# Patient Record
Sex: Female | Born: 1980 | Hispanic: Yes | Marital: Single | Smoking: Never smoker
Health system: Southern US, Community
[De-identification: ages and names within clinical notes are randomized; demographics above are authoritative.]

## PROBLEM LIST (undated history)

## (undated) DIAGNOSIS — Z789 Other specified health status: Secondary | ICD-10-CM

## (undated) HISTORY — PX: DILATION AND CURETTAGE OF UTERUS: SHX78

---

## 2004-02-18 ENCOUNTER — Ambulatory Visit: Payer: Self-pay | Admitting: Family Medicine

## 2004-07-12 ENCOUNTER — Inpatient Hospital Stay: Payer: Self-pay

## 2007-01-17 ENCOUNTER — Ambulatory Visit: Payer: Self-pay | Admitting: Family Medicine

## 2007-01-20 ENCOUNTER — Ambulatory Visit: Payer: Self-pay | Admitting: Obstetrics and Gynecology

## 2007-08-06 ENCOUNTER — Ambulatory Visit: Payer: Self-pay | Admitting: Family Medicine

## 2007-10-02 ENCOUNTER — Encounter: Payer: Self-pay | Admitting: Maternal & Fetal Medicine

## 2007-11-13 ENCOUNTER — Encounter: Payer: Self-pay | Admitting: Maternal & Fetal Medicine

## 2007-12-18 ENCOUNTER — Inpatient Hospital Stay: Payer: Self-pay

## 2007-12-30 ENCOUNTER — Emergency Department: Payer: Self-pay | Admitting: Emergency Medicine

## 2016-06-12 ENCOUNTER — Other Ambulatory Visit: Payer: Self-pay | Admitting: Primary Care

## 2016-06-12 DIAGNOSIS — Z3689 Encounter for other specified antenatal screening: Secondary | ICD-10-CM

## 2016-06-25 ENCOUNTER — Ambulatory Visit: Payer: Self-pay

## 2016-07-16 ENCOUNTER — Ambulatory Visit (HOSPITAL_BASED_OUTPATIENT_CLINIC_OR_DEPARTMENT_OTHER)
Admission: RE | Admit: 2016-07-16 | Discharge: 2016-07-16 | Disposition: A | Discharge status: Home / Self Care | Payer: Self-pay | Source: Ambulatory Visit | Attending: Maternal & Fetal Medicine | Admitting: Maternal & Fetal Medicine

## 2016-07-16 ENCOUNTER — Other Ambulatory Visit: Payer: Self-pay | Admitting: *Deleted

## 2016-07-16 ENCOUNTER — Ambulatory Visit
Admission: RE | Admit: 2016-07-16 | Discharge: 2016-07-16 | Disposition: A | Discharge status: Home / Self Care | Payer: Self-pay | Source: Ambulatory Visit | Attending: Maternal & Fetal Medicine | Admitting: Maternal & Fetal Medicine

## 2016-07-16 VITALS — BP 111/59 | HR 83 | Temp 98.0°F | Resp 16 | Ht 63.0 in | Wt 142.2 lb

## 2016-07-16 DIAGNOSIS — O09512 Supervision of elderly primigravida, second trimester: Secondary | ICD-10-CM | POA: Insufficient documentation

## 2016-07-16 DIAGNOSIS — O09522 Supervision of elderly multigravida, second trimester: Secondary | ICD-10-CM | POA: Insufficient documentation

## 2016-07-16 DIAGNOSIS — Z3A20 20 weeks gestation of pregnancy: Secondary | ICD-10-CM | POA: Insufficient documentation

## 2016-07-16 DIAGNOSIS — Z3689 Encounter for other specified antenatal screening: Secondary | ICD-10-CM | POA: Insufficient documentation

## 2016-07-16 DIAGNOSIS — Z9889 Other specified postprocedural states: Secondary | ICD-10-CM

## 2016-07-16 DIAGNOSIS — O09529 Supervision of elderly multigravida, unspecified trimester: Secondary | ICD-10-CM

## 2016-07-16 HISTORY — DX: Other specified health status: Z78.9

## 2016-07-16 NOTE — Progress Notes (Addendum)
Referring Provider:  [facility] Length of Consultation: 40 minutes  [name] was referred to [facility] [place] for genetic counseling because of advanced maternal age.  The patient will be 36 years old at the time of delivery.  This note summarizes the information we discussed.    We explained that the chance of a chromosome abnormality increases with maternal age.  Chromosomes and examples of chromosome problems were reviewed.  Humans typically have 46 chromosomes in each cell, with half passed through each sperm and egg.  Any change in the number or structure of chromosomes can increase the risk of problems in the physical and mental development of a pregnancy.   Based upon age of the patient, the chance of any chromosome abnormality was 1 in 56. The chance of Down syndrome, the most common chromosome problem associated with maternal age, was 1 in 32.  The risk of chromosome problems is in addition to the 3% general population risk for birth defects and mental retardation.  The greatest chance, of course, is that the baby would be born in good health.  We discussed the following prenatal screening and testing options for this pregnancy:  Maternal serum marker screening, a blood test that measures pregnancy proteins, can provide risk assessments for Down syndrome, trisomy 18, and open neural tube defects (spina bifida, anencephaly). Because it does not directly examine the fetus, it cannot positively diagnose or rule out these problems.  Targeted ultrasound uses high frequency sound waves to create an image of the developing fetus.  An ultrasound is often recommended as a routine means of evaluating the pregnancy.  It is also used to screen for fetal anatomy problems (for example, a heart defect) that might be suggestive of a chromosomal or other abnormality.   Amniocentesis involves the removal of a small amount of amniotic fluid from the sac  surrounding the fetus with the use of a thin needle inserted through the maternal abdomen and uterus.  Ultrasound guidance is used throughout the procedure.  Fetal cells from amniotic fluid are directly evaluated and > 99.5% of chromosome problems and > 98% of open neural tube defects can be detected. This procedure is generally performed after the 15th week of pregnancy.  The main risks to this procedure include complications leading to miscarriage in less than 1 in 200 cases (0.5%).  We also reviewed the availability of cell free fetal DNA testing from maternal blood to determine whether or not the baby may have either Down syndrome, trisomy 42, or trisomy 11.  This test utilizes a maternal blood sample and DNA sequencing technology to isolate circulating cell free fetal DNA from maternal plasma.  The fetal DNA can then be analyzed for DNA sequences that are derived from the three most common chromosomes involved in aneuploidy, chromosomes 13, 18, and 21.  If the overall amount of DNA is greater than the expected level for any of these chromosomes, aneuploidy is suspected.  While we do not consider it a replacement for invasive testing and karyotype analysis, a negative result from this testing would be reassuring, though not a guarantee of a normal chromosome complement for the baby.  An abnormal result is certainly suggestive of an abnormal chromosome complement, though we would still recommend amniocentesis to confirm any findings from this testing.  Cystic Fibrosis and Spinal Muscular Atrophy (SMA) screening were also discussed with the patient. Both conditions are recessive, which means that both parents must be carriers in order  to have a child with the disease.  Cystic fibrosis (CF) is one of the most common genetic conditions in persons of Caucasian ancestry.  This condition occurs in approximately 1 in 2,500 Caucasian persons and results in thickened secretions in the lungs, digestive, and  reproductive systems.  For a baby to be at risk for having CF, both of the parents must be carriers for this condition.  Approximately 1 in 6925 Caucasian persons is a carrier for CF.  Current carrier testing looks for the most common mutations in the gene for CF and can detect approximately 90% of carriers in the Caucasian population.  This means that the carrier screening can greatly reduce, but cannot eliminate, the chance for an individual to have a child with CF.  If an individual is found to be a carrier for CF, then carrier testing would be available for the partner. As part of [place]'s newborn screening profile, all babies born in the state of [place] will have a two-tier screening process.  Specimens are first tested to determine the concentration of immunoreactive trypsinogen (IRT).  The top 5% of specimens with the highest IRT values then undergo DNA testing using a panel of over 40 common CF mutations. SMA is a neurodegenerative disorder that leads to atrophy of skeletal muscle and overall weakness.  This condition is also more prevalent in the Caucasian population, with 1 in 40-1 in 60 persons being a carrier and 1 in 6,000-1 in 10,000 children being affected.  There are multiple forms of the disease, with some causing death in infancy to other forms with survival into adulthood.  The genetics of SMA is complex, but carrier screening can detect up to 95% of carriers in the Caucasian population.  Similar to CF, a negative result can greatly reduce, but cannot eliminate, the chance to have a child with SMA.  We obtained a detailed family history and pregnancy history.  The family history is unremarkable for birth defects, developmental delays, recurrent pregnancy loss or known chromosome abnormalities.  [name] stated that this is her fourth pregnancy.  She and her husband have two healthy daughters, ages 8212 and 188.  She had one first trimester miscarriage.  She reported no  complications or exposures in this pregnancy that would be expected to increase the risk for birth defects.  She did report spotting at [redacted] weeks gestation, but none since that time.  After consideration of the options, [name] elected to proceed with an ultrasound only and to decline aneuploidy screening, CF and SMA testing.  An ultrasound was performed at the time of the visit.  The gestational age was consistent with  20 weeks, 5 days.   No markers of aneuploidy were noted, but it is important to remember that a normal ultrasound does not exclude the possibility of birth defect or chromosome condition.  Please refer to the ultrasound report for details of that study.  [name] was encouraged to call with questions or concerns.  We can be contacted at [phone].    Cherly Andersoneborah F. Wells, MS, CGC  I was immediately available and supervising. Argentina PonderAndra H. Desi Rowe, MD [facility]

## 2016-09-06 ENCOUNTER — Other Ambulatory Visit: Payer: Self-pay | Admitting: *Deleted

## 2016-09-06 DIAGNOSIS — O4440 Low lying placenta NOS or without hemorrhage, unspecified trimester: Secondary | ICD-10-CM

## 2016-09-10 ENCOUNTER — Ambulatory Visit
Admission: RE | Admit: 2016-09-10 | Discharge: 2016-09-10 | Disposition: A | Discharge status: Home / Self Care | Payer: Self-pay | Source: Ambulatory Visit | Attending: Maternal & Fetal Medicine | Admitting: Maternal & Fetal Medicine

## 2016-09-10 ENCOUNTER — Other Ambulatory Visit: Payer: Self-pay | Admitting: *Deleted

## 2016-09-10 DIAGNOSIS — Z3A28 28 weeks gestation of pregnancy: Secondary | ICD-10-CM | POA: Insufficient documentation

## 2016-09-10 DIAGNOSIS — O4440 Low lying placenta NOS or without hemorrhage, unspecified trimester: Secondary | ICD-10-CM

## 2016-09-10 DIAGNOSIS — O4403 Placenta previa specified as without hemorrhage, third trimester: Secondary | ICD-10-CM

## 2016-09-10 DIAGNOSIS — O4443 Low lying placenta NOS or without hemorrhage, third trimester: Secondary | ICD-10-CM | POA: Insufficient documentation

## 2016-09-10 DIAGNOSIS — O09523 Supervision of elderly multigravida, third trimester: Secondary | ICD-10-CM | POA: Insufficient documentation

## 2016-11-05 ENCOUNTER — Other Ambulatory Visit: Payer: Self-pay | Admitting: Maternal & Fetal Medicine

## 2016-11-05 ENCOUNTER — Ambulatory Visit
Admission: RE | Admit: 2016-11-05 | Discharge: 2016-11-05 | Disposition: A | Discharge status: Home / Self Care | Payer: Self-pay | Source: Ambulatory Visit | Attending: Maternal & Fetal Medicine | Admitting: Maternal & Fetal Medicine

## 2016-11-05 DIAGNOSIS — O4443 Low lying placenta NOS or without hemorrhage, third trimester: Secondary | ICD-10-CM

## 2016-11-05 DIAGNOSIS — Z3A36 36 weeks gestation of pregnancy: Secondary | ICD-10-CM | POA: Insufficient documentation

## 2016-11-23 ENCOUNTER — Inpatient Hospital Stay
Admission: EM | Admit: 2016-11-23 | Discharge: 2016-11-25 | DRG: 765 | Disposition: A | Discharge status: Home / Self Care | Payer: Medicaid Other | Attending: Obstetrics and Gynecology | Admitting: Obstetrics and Gynecology

## 2016-11-23 ENCOUNTER — Encounter
Admission: EM | Disposition: A | Discharge status: Home / Self Care | Payer: Self-pay | Source: Home / Self Care | Attending: Obstetrics and Gynecology

## 2016-11-23 ENCOUNTER — Inpatient Hospital Stay: Payer: Medicaid Other | Admitting: Anesthesiology

## 2016-11-23 DIAGNOSIS — O324XX0 Maternal care for high head at term, not applicable or unspecified: Principal | ICD-10-CM | POA: Diagnosis present

## 2016-11-23 DIAGNOSIS — O4443 Low lying placenta NOS or without hemorrhage, third trimester: Secondary | ICD-10-CM | POA: Diagnosis present

## 2016-11-23 DIAGNOSIS — Z3A39 39 weeks gestation of pregnancy: Secondary | ICD-10-CM

## 2016-11-23 DIAGNOSIS — Z98891 History of uterine scar from previous surgery: Secondary | ICD-10-CM

## 2016-11-23 DIAGNOSIS — Z3493 Encounter for supervision of normal pregnancy, unspecified, third trimester: Secondary | ICD-10-CM | POA: Diagnosis present

## 2016-11-23 LAB — CBC
HEMATOCRIT: 45.7 % (ref 35.0–47.0)
HEMOGLOBIN: 15.7 g/dL (ref 12.0–16.0)
MCH: 36.9 pg — ABNORMAL HIGH (ref 26.0–34.0)
MCHC: 34.4 g/dL (ref 32.0–36.0)
MCV: 107.3 fL — AB (ref 80.0–100.0)
Platelets: 167 10*3/uL (ref 150–440)
RBC: 4.26 MIL/uL (ref 3.80–5.20)
RDW: 13.2 % (ref 11.5–14.5)
WBC: 18.7 10*3/uL — AB (ref 3.6–11.0)

## 2016-11-23 LAB — TYPE AND SCREEN
ABO/RH(D): A POS
ANTIBODY SCREEN: NEGATIVE

## 2016-11-23 SURGERY — Surgical Case
Anesthesia: Epidural | Site: Abdomen | Wound class: Clean Contaminated

## 2016-11-23 MED ORDER — OXYTOCIN 40 UNITS IN LACTATED RINGERS INFUSION - SIMPLE MED
2.5000 [IU]/h | INTRAVENOUS | Status: DC
  Administered 2016-11-24: 2.5 [IU]/h via INTRAVENOUS
  Filled 2016-11-23: qty 1000

## 2016-11-23 MED ORDER — FENTANYL 2.5 MCG/ML W/ROPIVACAINE 0.15% IN NS 100 ML EPIDURAL (ARMC): EPIDURAL | Status: DC | PRN

## 2016-11-23 MED ORDER — CEFAZOLIN SODIUM-DEXTROSE 2-4 GM/100ML-% IV SOLN
2.0000 g | INTRAVENOUS | Status: AC
  Administered 2016-11-23: 2 g via INTRAVENOUS
  Filled 2016-11-23: qty 100

## 2016-11-23 MED ORDER — OXYCODONE HCL 5 MG PO TABS: 10.0000 mg | ORAL_TABLET | ORAL | Status: DC | PRN

## 2016-11-23 MED ORDER — MEPERIDINE HCL 50 MG/ML IJ SOLN
6.2500 mg | INTRAMUSCULAR | Status: DC | PRN
  Administered 2016-11-23 (×2): 6.25 mg via INTRAVENOUS

## 2016-11-23 MED ORDER — OXYTOCIN 40 UNITS IN LACTATED RINGERS INFUSION - SIMPLE MED
2.5000 [IU]/h | INTRAVENOUS | Status: DC
  Administered 2016-11-23: 1000 mL via INTRAVENOUS
  Filled 2016-11-23: qty 1000

## 2016-11-23 MED ORDER — SODIUM CHLORIDE 0.9% FLUSH: 3.0000 mL | INTRAVENOUS | Status: DC | PRN

## 2016-11-23 MED ORDER — WITCH HAZEL-GLYCERIN EX PADS: 1.0000 "application " | MEDICATED_PAD | CUTANEOUS | Status: DC | PRN

## 2016-11-23 MED ORDER — DIPHENHYDRAMINE HCL 25 MG PO CAPS: 25.0000 mg | ORAL_CAPSULE | ORAL | Status: DC | PRN

## 2016-11-23 MED ORDER — NALBUPHINE HCL 10 MG/ML IJ SOLN: 5.0000 mg | INTRAMUSCULAR | Status: DC | PRN

## 2016-11-23 MED ORDER — ACETAMINOPHEN 325 MG PO TABS
650.0000 mg | ORAL_TABLET | ORAL | Status: DC | PRN
  Administered 2016-11-23: 650 mg via ORAL

## 2016-11-23 MED ORDER — OXYCODONE-ACETAMINOPHEN 5-325 MG PO TABS: 1.0000 | ORAL_TABLET | ORAL | Status: DC | PRN

## 2016-11-23 MED ORDER — BUPIVACAINE HCL (PF) 0.5 % IJ SOLN
INTRAMUSCULAR | Status: AC
  Filled 2016-11-23: qty 30

## 2016-11-23 MED ORDER — MEPERIDINE HCL 50 MG/ML IJ SOLN: 6.2500 mg | INTRAMUSCULAR | Status: DC | PRN

## 2016-11-23 MED ORDER — BUPIVACAINE LIPOSOME 1.3 % IJ SUSP
20.0000 mL | Freq: Once | INTRAMUSCULAR | Status: DC
  Filled 2016-11-23: qty 20

## 2016-11-23 MED ORDER — LIDOCAINE HCL (PF) 1 % IJ SOLN
INTRAMUSCULAR | Status: DC
Start: 2016-11-23 — End: 2016-11-23
  Filled 2016-11-23: qty 30

## 2016-11-23 MED ORDER — DEXTROSE 5 % IV SOLN
500.0000 mg | Freq: Once | INTRAVENOUS | Status: AC
  Administered 2016-11-23: 500 mg via INTRAVENOUS
  Filled 2016-11-23: qty 500

## 2016-11-23 MED ORDER — ONDANSETRON HCL 4 MG/2ML IJ SOLN: 4.0000 mg | Freq: Four times a day (QID) | INTRAMUSCULAR | Status: DC | PRN

## 2016-11-23 MED ORDER — PRENATAL MULTIVITAMIN CH
1.0000 | ORAL_TABLET | Freq: Every day | ORAL | Status: DC
  Administered 2016-11-24 – 2016-11-25 (×2): 1 via ORAL
  Filled 2016-11-23 (×2): qty 1

## 2016-11-23 MED ORDER — KETOROLAC TROMETHAMINE 30 MG/ML IJ SOLN
INTRAMUSCULAR | Status: AC
  Administered 2016-11-23: 30 mg via INTRAVENOUS
  Filled 2016-11-23: qty 1

## 2016-11-23 MED ORDER — ONDANSETRON HCL 4 MG/2ML IJ SOLN: 4.0000 mg | Freq: Three times a day (TID) | INTRAMUSCULAR | Status: DC | PRN

## 2016-11-23 MED ORDER — OXYCODONE HCL 5 MG PO TABS: 5.0000 mg | ORAL_TABLET | Freq: Once | ORAL | Status: DC | PRN

## 2016-11-23 MED ORDER — MISOPROSTOL 200 MCG PO TABS
ORAL_TABLET | ORAL | Status: AC
  Filled 2016-11-23: qty 4

## 2016-11-23 MED ORDER — MEPERIDINE HCL 50 MG/ML IJ SOLN
INTRAMUSCULAR | Status: AC
  Administered 2016-11-23: 6.25 mg via INTRAVENOUS
  Filled 2016-11-23: qty 1

## 2016-11-23 MED ORDER — NALOXONE HCL 0.4 MG/ML IJ SOLN: 0.4000 mg | INTRAMUSCULAR | Status: DC | PRN

## 2016-11-23 MED ORDER — SIMETHICONE 80 MG PO CHEW
80.0000 mg | CHEWABLE_TABLET | Freq: Three times a day (TID) | ORAL | Status: DC
  Administered 2016-11-24 – 2016-11-25 (×5): 80 mg via ORAL
  Filled 2016-11-23 (×5): qty 1

## 2016-11-23 MED ORDER — BUPIVACAINE HCL (PF) 0.25 % IJ SOLN
INTRAMUSCULAR | Status: DC | PRN
  Administered 2016-11-23: 1 mL via INTRATHECAL

## 2016-11-23 MED ORDER — DIBUCAINE 1 % RE OINT: 1.0000 "application " | TOPICAL_OINTMENT | RECTAL | Status: DC | PRN

## 2016-11-23 MED ORDER — AMMONIA AROMATIC IN INHA
RESPIRATORY_TRACT | Status: AC
  Filled 2016-11-23: qty 10

## 2016-11-23 MED ORDER — ACETAMINOPHEN 325 MG PO TABS
ORAL_TABLET | ORAL | Status: AC
  Filled 2016-11-23: qty 2

## 2016-11-23 MED ORDER — FENTANYL CITRATE (PF) 100 MCG/2ML IJ SOLN: 25.0000 ug | INTRAMUSCULAR | Status: DC | PRN

## 2016-11-23 MED ORDER — SOD CITRATE-CITRIC ACID 500-334 MG/5ML PO SOLN
30.0000 mL | ORAL | Status: DC | PRN
  Filled 2016-11-23: qty 15

## 2016-11-23 MED ORDER — BUPIVACAINE HCL (PF) 0.5 % IJ SOLN
INTRAMUSCULAR | Status: DC | PRN
  Administered 2016-11-23: 30 mL

## 2016-11-23 MED ORDER — EPHEDRINE SULFATE 50 MG/ML IJ SOLN
INTRAMUSCULAR | Status: DC | PRN
  Administered 2016-11-23: 10 mg via INTRAVENOUS

## 2016-11-23 MED ORDER — SENNOSIDES-DOCUSATE SODIUM 8.6-50 MG PO TABS
2.0000 | ORAL_TABLET | ORAL | Status: DC
  Administered 2016-11-24 (×2): 2 via ORAL
  Filled 2016-11-23 (×2): qty 2

## 2016-11-23 MED ORDER — CARBOPROST TROMETHAMINE 250 MCG/ML IM SOLN
INTRAMUSCULAR | Status: AC
  Filled 2016-11-23: qty 1

## 2016-11-23 MED ORDER — MEASLES, MUMPS & RUBELLA VAC ~~LOC~~ INJ
0.5000 mL | INJECTION | Freq: Once | SUBCUTANEOUS | Status: DC
  Filled 2016-11-23: qty 0.5

## 2016-11-23 MED ORDER — NITROGLYCERIN IN D5W 200-5 MCG/ML-% IV SOLN
INTRAVENOUS | Status: AC
  Filled 2016-11-23: qty 250

## 2016-11-23 MED ORDER — BUPIVACAINE LIPOSOME 1.3 % IJ SUSP
INTRAMUSCULAR | Status: AC
  Filled 2016-11-23: qty 20

## 2016-11-23 MED ORDER — NITROGLYCERIN 0.4 MG/SPRAY TL SOLN
Status: AC
  Filled 2016-11-23: qty 4.9

## 2016-11-23 MED ORDER — DIPHENHYDRAMINE HCL 50 MG/ML IJ SOLN: 12.5000 mg | INTRAMUSCULAR | Status: DC | PRN

## 2016-11-23 MED ORDER — SIMETHICONE 80 MG PO CHEW
80.0000 mg | CHEWABLE_TABLET | ORAL | Status: DC
  Administered 2016-11-24 (×2): 80 mg via ORAL
  Filled 2016-11-23 (×2): qty 1

## 2016-11-23 MED ORDER — SODIUM CHLORIDE 0.9 % IJ SOLN
INTRAMUSCULAR | Status: AC
Start: 2016-11-23 — End: 2016-11-24
  Filled 2016-11-23: qty 50

## 2016-11-23 MED ORDER — FENTANYL 2.5 MCG/ML W/ROPIVACAINE 0.15% IN NS 100 ML EPIDURAL (ARMC)
EPIDURAL | Status: AC
  Filled 2016-11-23: qty 100

## 2016-11-23 MED ORDER — MORPHINE SULFATE (PF) 0.5 MG/ML IJ SOLN
INTRAMUSCULAR | Status: AC
  Filled 2016-11-23: qty 10

## 2016-11-23 MED ORDER — LACTATED RINGERS IV SOLN
INTRAVENOUS | Status: DC
  Administered 2016-11-24: 13:00:00 via INTRAVENOUS

## 2016-11-23 MED ORDER — SOD CITRATE-CITRIC ACID 500-334 MG/5ML PO SOLN
30.0000 mL | ORAL | Status: AC
  Administered 2016-11-23: 30 mL via ORAL

## 2016-11-23 MED ORDER — KETOROLAC TROMETHAMINE 30 MG/ML IJ SOLN
30.0000 mg | Freq: Four times a day (QID) | INTRAMUSCULAR | Status: AC
  Administered 2016-11-23 – 2016-11-24 (×4): 30 mg via INTRAVENOUS
  Filled 2016-11-23 (×7): qty 1

## 2016-11-23 MED ORDER — OXYTOCIN BOLUS FROM INFUSION: 500.0000 mL | Freq: Once | INTRAVENOUS | Status: DC

## 2016-11-23 MED ORDER — KETOROLAC TROMETHAMINE 30 MG/ML IJ SOLN
30.0000 mg | Freq: Four times a day (QID) | INTRAMUSCULAR | Status: AC
  Filled 2016-11-23 (×4): qty 1

## 2016-11-23 MED ORDER — SIMETHICONE 80 MG PO CHEW: 80.0000 mg | CHEWABLE_TABLET | ORAL | Status: DC | PRN

## 2016-11-23 MED ORDER — LACTATED RINGERS IV SOLN: 500.0000 mL | INTRAVENOUS | Status: DC | PRN

## 2016-11-23 MED ORDER — TETANUS-DIPHTH-ACELL PERTUSSIS 5-2.5-18.5 LF-MCG/0.5 IM SUSP: 0.5000 mL | Freq: Once | INTRAMUSCULAR | Status: DC

## 2016-11-23 MED ORDER — NALBUPHINE HCL 10 MG/ML IJ SOLN: 5.0000 mg | Freq: Once | INTRAMUSCULAR | Status: DC | PRN

## 2016-11-23 MED ORDER — FENTANYL CITRATE (PF) 100 MCG/2ML IJ SOLN
INTRAMUSCULAR | Status: DC | PRN
  Administered 2016-11-23: 100 ug via EPIDURAL

## 2016-11-23 MED ORDER — LIDOCAINE HCL (PF) 1 % IJ SOLN: 30.0000 mL | INTRAMUSCULAR | Status: DC | PRN

## 2016-11-23 MED ORDER — ACETAMINOPHEN 500 MG PO TABS
1000.0000 mg | ORAL_TABLET | Freq: Four times a day (QID) | ORAL | Status: DC
  Administered 2016-11-24 (×3): 1000 mg via ORAL
  Filled 2016-11-23 (×4): qty 2

## 2016-11-23 MED ORDER — ONDANSETRON HCL 4 MG/2ML IJ SOLN
INTRAMUSCULAR | Status: DC | PRN
  Administered 2016-11-23: 4 mg via INTRAVENOUS

## 2016-11-23 MED ORDER — PROMETHAZINE HCL 25 MG/ML IJ SOLN: 6.2500 mg | INTRAMUSCULAR | Status: DC | PRN

## 2016-11-23 MED ORDER — OXYTOCIN 10 UNIT/ML IJ SOLN
INTRAMUSCULAR | Status: AC
  Filled 2016-11-23: qty 2

## 2016-11-23 MED ORDER — SODIUM CHLORIDE 0.9 % IV SOLN
INTRAVENOUS | Status: DC | PRN
  Administered 2016-11-23: 70 mL

## 2016-11-23 MED ORDER — FENTANYL 2.5 MCG/ML W/ROPIVACAINE 0.15% IN NS 100 ML EPIDURAL (ARMC)
EPIDURAL | Status: DC | PRN
  Administered 2016-11-23: 12 mL/h via EPIDURAL

## 2016-11-23 MED ORDER — IBUPROFEN 600 MG PO TABS
600.0000 mg | ORAL_TABLET | Freq: Four times a day (QID) | ORAL | Status: DC
  Administered 2016-11-24 – 2016-11-25 (×3): 600 mg via ORAL
  Filled 2016-11-23 (×3): qty 1

## 2016-11-23 MED ORDER — BISACODYL 10 MG RE SUPP: 10.0000 mg | Freq: Every day | RECTAL | Status: DC | PRN

## 2016-11-23 MED ORDER — OXYCODONE HCL 5 MG PO TABS: 5.0000 mg | ORAL_TABLET | ORAL | Status: DC | PRN

## 2016-11-23 MED ORDER — LACTATED RINGERS IV SOLN
INTRAVENOUS | Status: DC
  Administered 2016-11-23: 19:00:00 via INTRAVENOUS

## 2016-11-23 MED ORDER — LIDOCAINE 2% (20 MG/ML) 5 ML SYRINGE
INTRAMUSCULAR | Status: DC | PRN
  Administered 2016-11-23: 100 mg via INTRAVENOUS
  Administered 2016-11-23: 60 mg via INTRAVENOUS
  Administered 2016-11-23: 100 mg via INTRAVENOUS
  Administered 2016-11-23: 60 mg via INTRAVENOUS

## 2016-11-23 MED ORDER — METHYLERGONOVINE MALEATE 0.2 MG/ML IJ SOLN
INTRAMUSCULAR | Status: AC
  Filled 2016-11-23: qty 1

## 2016-11-23 MED ORDER — MENTHOL 3 MG MT LOZG
1.0000 | LOZENGE | OROMUCOSAL | Status: DC | PRN
  Filled 2016-11-23: qty 9

## 2016-11-23 MED ORDER — MORPHINE SULFATE (PF) 0.5 MG/ML IJ SOLN
INTRAMUSCULAR | Status: DC | PRN
  Administered 2016-11-23: 3 mg via EPIDURAL

## 2016-11-23 MED ORDER — MEDROXYPROGESTERONE ACETATE 150 MG/ML IM SUSP
150.0000 mg | INTRAMUSCULAR | Status: DC
  Administered 2016-11-25: 150 mg via INTRAMUSCULAR
  Filled 2016-11-23: qty 1

## 2016-11-23 MED ORDER — PHENYLEPHRINE HCL 10 MG/ML IJ SOLN
INTRAMUSCULAR | Status: DC | PRN
  Administered 2016-11-23 (×4): 100 ug via INTRAVENOUS

## 2016-11-23 MED ORDER — FENTANYL CITRATE (PF) 100 MCG/2ML IJ SOLN
INTRAMUSCULAR | Status: AC
  Filled 2016-11-23: qty 2

## 2016-11-23 MED ORDER — COCONUT OIL OIL: 1.0000 "application " | TOPICAL_OIL | Status: DC | PRN

## 2016-11-23 MED ORDER — OXYCODONE HCL 5 MG/5ML PO SOLN: 5.0000 mg | Freq: Once | ORAL | Status: DC | PRN

## 2016-11-23 MED ORDER — FLEET ENEMA 7-19 GM/118ML RE ENEM: 1.0000 | ENEMA | Freq: Every day | RECTAL | Status: DC | PRN

## 2016-11-23 MED ORDER — SODIUM CHLORIDE 0.9 % IV SOLN
INTRAVENOUS | Status: DC | PRN
  Administered 2016-11-23 (×2): 5 mL via EPIDURAL

## 2016-11-23 MED ORDER — ACETAMINOPHEN 325 MG PO TABS
650.0000 mg | ORAL_TABLET | ORAL | Status: DC | PRN
  Administered 2016-11-24 – 2016-11-25 (×2): 650 mg via ORAL
  Filled 2016-11-23 (×2): qty 2

## 2016-11-23 MED ORDER — OXYCODONE-ACETAMINOPHEN 5-325 MG PO TABS: 2.0000 | ORAL_TABLET | ORAL | Status: DC | PRN

## 2016-11-23 MED ORDER — LIDOCAINE-EPINEPHRINE (PF) 1.5 %-1:200000 IJ SOLN
INTRAMUSCULAR | Status: DC | PRN
  Administered 2016-11-23: 3 mL

## 2016-11-23 SURGICAL SUPPLY — 28 items
BARRIER ADHS 3X4 INTERCEED (GAUZE/BANDAGES/DRESSINGS) ×3 IMPLANT
CANISTER SUCT 3000ML PPV (MISCELLANEOUS) ×3 IMPLANT
CHLORAPREP W/TINT 26ML (MISCELLANEOUS) ×6 IMPLANT
DERMABOND ADVANCED (GAUZE/BANDAGES/DRESSINGS) ×2
DERMABOND ADVANCED .7 DNX12 (GAUZE/BANDAGES/DRESSINGS) ×1 IMPLANT
DRSG TELFA 3X8 NADH (GAUZE/BANDAGES/DRESSINGS) IMPLANT
ELECT REM PT RETURN 9FT ADLT (ELECTROSURGICAL) ×3
ELECTRODE REM PT RTRN 9FT ADLT (ELECTROSURGICAL) ×1 IMPLANT
GAUZE SPONGE 4X4 12PLY STRL (GAUZE/BANDAGES/DRESSINGS) ×3 IMPLANT
GLOVE BIO SURGEON STRL SZ 6.5 (GLOVE) ×6 IMPLANT
GLOVE BIO SURGEON STRL SZ7.5 (GLOVE) ×9 IMPLANT
GLOVE BIO SURGEONS STRL SZ 6.5 (GLOVE) ×3
GOWN STRL REUS W/ TWL LRG LVL3 (GOWN DISPOSABLE) ×3 IMPLANT
GOWN STRL REUS W/TWL LRG LVL3 (GOWN DISPOSABLE) ×6
NS IRRIG 1000ML POUR BTL (IV SOLUTION) ×3 IMPLANT
PAD OB MATERNITY 4.3X12.25 (PERSONAL CARE ITEMS) ×6 IMPLANT
PAD PREP 24X41 OB/GYN DISP (PERSONAL CARE ITEMS) ×3 IMPLANT
SPONGE LAP 18X18 5 PK (GAUZE/BANDAGES/DRESSINGS) ×6 IMPLANT
SUT MNCRL 4-0 (SUTURE)
SUT MNCRL 4-0 27XMFL (SUTURE)
SUT PDS AB 1 TP1 96 (SUTURE) ×3 IMPLANT
SUT PLAIN 2 0 XLH (SUTURE) ×3 IMPLANT
SUT PLAIN GUT 2-0 30 C14 SG823 (SUTURE) ×3
SUT VIC AB 0 CT1 36 (SUTURE) ×9 IMPLANT
SUT VIC AB 3-0 SH 27 (SUTURE) ×2
SUT VIC AB 3-0 SH 27X BRD (SUTURE) ×1 IMPLANT
SUTURE MNCRL 4-0 27XMF (SUTURE) IMPLANT
SUTURE PLN GUT2-0 30 C14 SG823 (SUTURE) ×1 IMPLANT

## 2016-11-23 NOTE — Progress Notes (Signed)
No further progress with pushing x30 more minutes. Urgent cesarean called.  The risks of cesarean section discussed with the patient included but were not limited to: bleeding which may require transfusion or reoperation; infection which may require antibiotics; injury to bowel, bladder, ureters or other surrounding organs; injury to the fetus; need for additional procedures including hysterectomy in the event of a life-threatening hemorrhage; placental abnormalities wth subsequent pregnancies, incisional problems, thromboembolic phenomenon and other postoperative/anesthesia complications. The patient concurred with the proposed plan, giving informed written consent for the procedure.  Anesthesia and OR aware. Preoperative prophylactic antibiotics and SCDs ordered on call to the OR.  To OR when ready.   Patient ID: Faith Wolfe, female   DOB: [birth year], 36 y.o.   MRN: [id number]

## 2016-11-23 NOTE — Anesthesia Procedure Notes (Signed)
Epidural Patient location during procedure: OB Start time: 11/23/2016 3:45 PM End time: 11/23/2016 4:02 PM  Staffing Anesthesiologist: Priscella MannPENWARDEN, Larita Deremer  Preanesthetic Checklist Completed: patient identified, site marked, surgical consent, pre-op evaluation, timeout performed, IV checked, risks and benefits discussed and monitors and equipment checked  Epidural Patient position: sitting Prep: ChloraPrep Patient monitoring: heart rate, continuous pulse ox and blood pressure Approach: midline Location: L4-L5 Injection technique: LOR saline  Needle:  Needle type: Tuohy  Needle gauge: 18 G Needle length: 9 cm and 9 Needle insertion depth: 5 cm Catheter type: closed end flexible Catheter size: 20 Guage Catheter at skin depth: 9 cm Test dose: negative  Assessment Events: blood not aspirated, injection not painful, no injection resistance, negative IV test and no paresthesia  Additional Notes CSE performed with needle through needle technique using 25g pencil tip spinal needle. 1mL 0.25% bupivacaine given for spinal dose.   Patient tolerated the insertion well without complications.Reason for block:procedure for pain

## 2016-11-23 NOTE — Progress Notes (Signed)
Intrapartum progress note:  S: breathing through painful contractions  O: BP 115/75   Pulse 75   Temp 98.3 F (36.8 C) (Oral)   Resp 18   Ht 5\' 4"  (1.626 m)   Wt 72.6 kg (160 lb)   LMP 02/22/2016   BMI 27.46 kg/m   FHT: 145 mod + accels no decels TOCO: q102min SVE: 10/100/-1  AROM for meconium, thin  A/P: [id number] @ 39.2 with labor  1. IUP: category 1 2. Labor: expectant management after AROM.  Push with one contraction ineffective, will let her labor down until more pressure felt or urge to push.    spainsh interpreter at bedside ----- Ranae Plumberhelsea Eleonora Peeler, MD Attending Obstetrician and Gynecologist [facility], Department of OB/GYN [facility]

## 2016-11-23 NOTE — Op Note (Signed)
  Cesarean Section Procedure Note  Date of procedure: 11/23/2016   Pre-operative Diagnosis: Intrauterine pregnancy at 8267w2d; arrest of descent at fully dilated with 4 hours of pushing; meconium stained fluid  Post-operative Diagnosis: same, delivered.  Procedure: Primary Low Transverse Cesarean Section through Pfannenstiel incision  Surgeon: Christeen DouglasBethany Cordelro Gautreau, MD  Assistant(s):  CST  Anesthesia: Epidural anesthesia and Spinal anesthesia  Anesthesiologist: No responsible provider has been recorded for the case. Anesthesiologist: Alver FisherPenwarden, Amy, MD CRNA: Irving BurtonBachich, Jennifer, CRNA  Estimated Blood Loss:  950         Drains: none         Total IV Fluids: 600ml  Urine Output: 100ml         Specimens: none         Complications:  None; patient tolerated the procedure well.         Disposition: PACU - hemodynamically stable.         Condition: stable  Findings:  A female infant in cephalic presentation. Amniotic fluid - Meconium  Birth weight 3930 g.  Apgars of 8 and 9 at one and five minutes respectively.  Intact placenta with a three-vessel cord.  Grossly normal uterus, tubes and ovaries bilaterally. No intraabdominal adhesions were noted.  Indications: failure to progress: arrest of descent  Procedure Details  The patient was taken to Operating Room, identified as the correct patient and the procedure verified as C-Section Delivery. A formal Time Out was held with all team members present and in agreement.  After induction of anesthesia, the patient was draped and prepped in the usual sterile manner. A Pfannenstiel skin incision was made and carried down through the subcutaneous tissue to the fascia. Fascial incision was made and extended transversely with the Mayo scissors. The fascia was separated from the underlying rectus tissue superiorly and inferiorly. The peritoneum was identified and entered bluntly. Peritoneal incision was extended longitudinally. The utero-vesical  peritoneal reflection was incised transversely and a bladder flap was created digitally.   A low transverse hysterotomy was made. The fetus was delivered atraumatically. The umbilical cord was clamped x2 and cut and the infant was handed to the awaiting pediatricians. The placenta was removed intact and appeared normal, intact, and with a 3-vessel cord.   The uterus was exteriorized and cleared of all clot and debris. The hysterotomy was closed with running sutures of 0-Vicryl. A second imbricating layer was placed with the same suture. Excellent hemostasis was observed. The peritoneal cavity was cleared of all clots and debris. The uterus was returned to the abdomen.   The pelvis was irrigated and again, excellent hemostasis was noted. The fascia was then reapproximated with running sutures of 0 Vicryl.  The subcutaneous tissue was reapproximated with running sutures of 0 Vicry. The skin was reapproximated with a 4-0 Monocryl subcuticular stitch. 20ml (in 30 of 0.5% bupivicaine and 50ml of NSS) of liposomal bupivicaine placed in the fascial and skin lines.  Instrument, sponge, and needle counts were correct prior to the abdominal closure and at the conclusion of the case.   The patient tolerated the procedure well and was transferred to the recovery room in stable condition.   Christeen DouglasBethany Bonne Whack, MD 11/23/2016

## 2016-11-23 NOTE — Anesthesia Preprocedure Evaluation (Signed)
Anesthesia Evaluation  Patient identified by MRN, date of birth, ID band Patient awake    Reviewed: Allergy & Precautions, NPO status , Patient's Chart, lab work & pertinent test results  History of Anesthesia Complications Negative for: history of anesthetic complications  Airway Mallampati: II  TM Distance: >3 FB Neck ROM: Full    Dental no notable dental hx.    Pulmonary neg pulmonary ROS, neg sleep apnea, neg COPD,    breath sounds clear to auscultation- rhonchi (-) wheezing      Cardiovascular Exercise Tolerance: Good (-) hypertension(-) CAD and (-) Past MI  Rhythm:Regular Rate:Normal - Systolic murmurs and - Diastolic murmurs    Neuro/Psych negative neurological ROS  negative psych ROS   GI/Hepatic negative GI ROS, Neg liver ROS,   Endo/Other  negative endocrine ROSneg diabetes  Renal/GU negative Renal ROS     Musculoskeletal negative musculoskeletal ROS (+)   Abdominal (+) - obese, Gravid abdomen  Peds  Hematology negative hematology ROS (+)   Anesthesia Other Findings   Reproductive/Obstetrics (+) Pregnancy                             Anesthesia Physical Anesthesia Plan  ASA: II  Anesthesia Plan: Epidural   Post-op Pain Management:    Induction:   PONV Risk Score and Plan: 2  Airway Management Planned:   Additional Equipment:   Intra-op Plan:   Post-operative Plan:   Informed Consent: I have reviewed the patients History and Physical, chart, labs and discussed the procedure including the risks, benefits and alternatives for the proposed anesthesia with the patient or authorized representative who has indicated his/her understanding and acceptance.     Plan Discussed with: Anesthesiologist  Anesthesia Plan Comments: (Plan for epidural for labor, discussed epidural vs spinal vs GA if need for csection)        Lab Results  Component Value Date   WBC 18.7  (H) 11/23/2016   HGB 15.7 11/23/2016   HCT 45.7 11/23/2016   MCV 107.3 (H) 11/23/2016   PLT 167 11/23/2016    Anesthesia Quick Evaluation

## 2016-11-23 NOTE — H&P (Signed)
OB ADMISSION/ HISTORY & PHYSICAL:  Admission Date: 11/23/2016 11:04 AM  Admit Diagnosis: Contractions  [name] is a 36 y.o. female presenting for contractions.  Prenatal History: G4P0   EDC : 11/28/2016, by Last Menstrual Period  Prenatal care at Primary Ob Provider: [facility]  Prenatal course complicated by hx of LEEP 2013 Low lying placenta, last measured 11/05/16 2.4cm from internal os  Prenatal Labs: ABO, Rh: --/--/A POS (08/03 1313) Antibody: NEG (08/03 1313) Rubella:   imm RPR:   non reactice HBsAg:   neg HIV:   neg GTT: neg GBS:   neg Medical / Surgical History :  Past medical history:  Past Medical History:  Diagnosis Date  . Medical history non-contributory      Past surgical history:  Past Surgical History:  Procedure Laterality Date  . DILATION AND CURETTAGE OF UTERUS      Family History: History reviewed. No pertinent family history.   Social History:  reports that she has never smoked. She has never used smokeless tobacco. She reports that she does not drink alcohol or use drugs.   Allergies: Patient has no known allergies.    Current Medications at time of admission:  Prior to Admission medications   Medication Sig Start Date End Date Taking? Authorizing Provider  Prenatal Vit-Fe Fumarate-FA (MULTIVITAMIN-PRENATAL) 27-0.8 MG TABS tablet Take 1 tablet by mouth daily at 12 noon.   Yes [provider]     Review of Systems: Active FM  Physical Exam:  VS: Blood pressure (!) 108/51, pulse 76, temperature 98.3 F (36.8 C), temperature source Oral, resp. rate 18, height 5\' 4"  (1.626 m), weight 160 lb (72.6 kg), last menstrual period 02/22/2016, SpO2 100 %.  General: alert and oriented, appears nad Heart: RRR Lungs: Clear lung fields Abdomen: Gravid, soft and non-tender, non-distended Extremities: no edema  Genitalia / VE: Dilation: 10 Effacement (%): 100 Station: -2 Exam by:: ward   FHR: baseline rate 140 /  variability mod / accelerations + / + decelerations TOCO: q2-3  Assessment: 39+[redacted] weeks gestation 1 stage of labor FHR category 2  Assuming care now. Pt fully dilated at 14:22 with inadequate pushing in -2 station and OP position. Pt received epidural now and is comfortable. By my exam, she is still -1. No meconium noted, but reported with pushing. Per prior provider, no fetal movement with pushing. Pt is NSVD x2 without complications previously. Will allow labor down now and restart pushing. However, will monitor fetal status and alter plan as clinically indicated.  Plan:  Admit for active labor Labs pending Epidural when desired Continuous fetal monitoring   1. Fetal Well being  - Fetal Tracing: Cat I- intrauterine res planned - Ultrasound: reviewed, as above - Group B Streptococcus: neg - Presentation: vtx confirmed by sutures   2. Routine OB: - Prenatal labs reviewed, as above - Rh a pos

## 2016-11-23 NOTE — Progress Notes (Signed)
Progress note: S: comfortable with epidural O:  Vitals:   11/23/16 1651 11/23/16 1652  BP: (!) 86/38 (!) 85/41  Pulse: 75 75  Resp:    Temp:      SVE: 10/100/-2/LOA Toco: q2 min Thick meconium with pushing. FHT: Cat II with mod var, occasional late decels,   A/P: Multiparous patient, Fully dilated x3 hrs.  Pushing with patient, but no fetal descent. Pt strongly desires vaginal delivery. Will continue to push until 1800, and will proceed with cesarean section at that time if no clear movement.

## 2016-11-23 NOTE — Discharge Summary (Signed)
Obstetrical Discharge Summary  Patient Name: Faith Wolfe DOB: 1980/07/07 MRN: [id number]  Date of Admission: 11/23/2016 Date of Discharge: 11/25/2016  Primary OB: [facility]  Gestational Age at Delivery: 7828w2d   Antepartum complications:  Prenatal course complicated by hx of LEEP 2013 Low lying placenta, last measured 11/05/16 2.4cm from internal os  Admitting Diagnosis: Active labor Secondary Diagnosis:Arrest of descent  Patient Active Problem List   Diagnosis Date Noted  . Indication for care in labor or delivery 11/23/2016  . Advanced maternal age in multigravida, second trimester     Augmentation: AROM Complications: None Intrapartum complications/course: Arrest of descent at -2 station, fully dilated for 5 hours Date of Delivery: 11/23/16 Delivered By: Christeen DouglasBethany Beasley  Delivery Type: primary cesarean section, low transverse incision Anesthesia: epidural Placenta: Spontaneous Laceration:  Episiotomy: none Newborn Data: Live born female "Gabon[place]iana" Birth Weight: 8 lb 10.6 oz (3930 g) APGAR: 8, 9    Discharge Physical Exam:  BP (!) 100/53 (BP Location: Left Arm)   Pulse 84   Temp 98.1 F (36.7 C) (Oral)   Resp 18   Ht 5\' 4"  (1.626 m)   Wt 72.6 kg (160 lb)   LMP 02/22/2016   SpO2 100%   Breastfeeding? Unknown   BMI 27.46 kg/m   General: NAD CV: RRR Pulm: CTABL, nl effort ABD: s/nd/nt, fundus firm and below the umbilicus Lochia: moderate Incision: c/d/i DVT Evaluation: LE non-ttp, no evidence of DVT on exam.  Hemoglobin  Date Value Ref Range Status  11/24/2016 12.2 12.0 - 16.0 g/dL Final   HCT  Date Value Ref Range Status  11/24/2016 34.4 (L) 35.0 - 47.0 % Final    Post partum course: asymptomatic and stable low blood pressure  Postpartum Procedures: none Disposition: stable, discharge to home. Baby Feeding: breastmilk  Baby Disposition: home with mom  Rh Immune globulin given: n/a Rubella vaccine given: no Tdap vaccine given in  AP or PP setting: offered PP Flu vaccine given in AP or PP setting: n/a  Contraception: Depo  Prenatal Labs:  ABO, Rh: --/--/A POS (08/03 1313) Antibody: NEG (08/03 1313) Rubella:   imm RPR:   non reactice HBsAg:   neg HIV:   neg GTT: neg GBS:   neg   Plan:  Faith Wolfe was discharged to home in good condition. [facility]   Discharge Medications: Allergies as of 11/25/2016   No Known Allergies     Medication List    TAKE these medications   acetaminophen 325 MG tablet Commonly known as:  TYLENOL Take 3 tablets (975 mg total) by mouth every 6 (six) hours.   ibuprofen 600 MG tablet Commonly known as:  ADVIL,MOTRIN Take 1 tablet (600 mg total) by mouth every 6 (six) hours.   multivitamin-prenatal 27-0.8 MG Tabs tablet Take 1 tablet by mouth daily at 12 noon.   oxyCODONE 5 MG immediate release tablet Commonly known as:  Oxy IR/ROXICODONE Take 1 tablet (5 mg total) by mouth every 4 (four) hours as needed for moderate pain.       Follow-up Information    Christeen DouglasBeasley, Bethany, MD Follow up in 2 week(s).   Specialty:  Obstetrics and Gynecology Why:  for post op check Contact information: [address] [place] [address] [phone]           Signed: ----- Ranae Plumberhelsea Shawnte Demarest, MD Attending Obstetrician and Gynecologist [facility], Department of OB/GYN [facility]

## 2016-11-23 NOTE — Progress Notes (Signed)
Intrapartum progress note  S: says she lacks the strength to push, and will not.  O: BP 115/75   Pulse 75   Temp 98.3 F (36.8 C) (Oral)   Resp 18   Ht 5\' 4"  (1.626 m)   Wt 72.6 kg (160 lb)   LMP 02/22/2016   BMI 27.46 kg/m   FHT: 130 min to mod +accels no decels  TOCO: q2-23min SVE: 10/100/-2  A/P: 36you [id number] @ 39.2 with labor  1. Complete and refusing to push. We have tried to have her in different positions.  Fetus rotated from OT to OP.  Too high in pelvis for operative delivery. Discussed options:  Epidural or spinal now to relax her and help her with pain relief to allow her to delivery vaginally, in minimal pain Cesarean delivery, which would be reserved for failure to descend after adequate pushing.   Will attempt regional anesthesia to try to get her to effectively push.   ----- Ranae Plumberhelsea Ward, MD Attending Obstetrician and Gynecologist [facility], Department of OB/GYN [facility]

## 2016-11-23 NOTE — Transfer of Care (Signed)
Immediate Anesthesia Transfer of Care Note  Patient: Faith Wolfe  Procedure(s) Performed: Procedure(s): CESAREAN SECTION (N/A)  Patient Location: PACU  Anesthesia Type:Epidural  Level of Consciousness: awake, alert  and oriented  Airway & Oxygen Therapy: Patient Spontanous Breathing  Post-op Assessment: Report given to RN  Post vital signs: stable  Last Vitals:  Vitals:   11/23/16 1853 11/23/16 2047  BP: (!) 98/58 (!) 92/58  Pulse: 92 93  Resp:  16  Temp:  36.8 C    Last Pain:  Vitals:   11/23/16 2047  TempSrc: Temporal  PainSc:          Complications: No apparent anesthesia complications

## 2016-11-23 NOTE — Anesthesia Post-op Follow-up Note (Cosign Needed)
Anesthesia QCDR form completed.        

## 2016-11-24 LAB — CBC
HCT: 34.4 % — ABNORMAL LOW (ref 35.0–47.0)
Hemoglobin: 12.2 g/dL (ref 12.0–16.0)
MCH: 38.1 pg — ABNORMAL HIGH (ref 26.0–34.0)
MCHC: 35.4 g/dL (ref 32.0–36.0)
MCV: 107.5 fL — ABNORMAL HIGH (ref 80.0–100.0)
Platelets: 137 K/uL — ABNORMAL LOW (ref 150–440)
RBC: 3.2 MIL/uL — ABNORMAL LOW (ref 3.80–5.20)
RDW: 13.1 % (ref 11.5–14.5)
WBC: 15.8 K/uL — ABNORMAL HIGH (ref 3.6–11.0)

## 2016-11-24 LAB — RPR: RPR: NONREACTIVE

## 2016-11-24 MED ORDER — LACTATED RINGERS IV BOLUS (SEPSIS)
500.0000 mL | Freq: Once | INTRAVENOUS | Status: AC
  Administered 2016-11-24: 500 mL via INTRAVENOUS

## 2016-11-24 NOTE — Anesthesia Postprocedure Evaluation (Signed)
Anesthesia Post Note  Patient: Faith Wolfe  Procedure(s) Performed: Procedure(s) (LRB): CESAREAN SECTION (N/A)  Patient location during evaluation: PACU Anesthesia Type: Epidural and Spinal Level of consciousness: oriented and awake and alert Pain management: pain level controlled Vital Signs Assessment: post-procedure vital signs reviewed and stable Respiratory status: spontaneous breathing, respiratory function stable and patient connected to nasal cannula oxygen Cardiovascular status: blood pressure returned to baseline and stable Postop Assessment: no headache and no backache Anesthetic complications: no     Last Vitals:  Vitals:   11/24/16 0600 11/24/16 0743  BP:  (!) 82/41  Pulse:  82  Resp: 18 18  Temp:  37.1 C    Last Pain:  Vitals:   11/24/16 0745  TempSrc:   PainSc: 0-No pain                 Dorisann Schwanke S

## 2016-11-24 NOTE — Progress Notes (Signed)
Subjective: Postpartum Day 1: Cesarean Delivery Patient reports tolerating PO and no problems voiding.  Pain controlled  Objective: Vital signs in last 24 hours: Temp:  [97.9 F (36.6 C)-99.6 F (37.6 C)] 98.2 F (36.8 C) (08/04 0500) Pulse Rate:  [66-140] 83 (08/04 0500) Resp:  [14-20] 18 (08/04 0600) BP: (77-117)/(35-102) 100/54 (08/04 0500) SpO2:  [93 %-100 %] 98 % (08/04 0600) Weight:  [160 lb (72.6 kg)] 160 lb (72.6 kg) (08/03 1127)  Physical Exam:  General: alert, cooperative and appears stated age 18Lochia: appropriate Uterine Fundus: firm Incision: healing well, no significant drainage, no dehiscence, no significant erythema DVT Evaluation: No evidence of DVT seen on physical exam. Negative Homan's sign. No cords or calf tenderness.   Recent Labs  11/23/16 1313  HGB 15.7  HCT 45.7    Assessment/Plan: Status post Cesarean section. Doing well postoperatively.  Continue current care.  Christeen DouglasBethany Oriyah Lamphear 11/24/2016, 7:38 AM

## 2016-11-25 ENCOUNTER — Encounter: Payer: Self-pay | Admitting: Obstetrics and Gynecology

## 2016-11-25 MED ORDER — IBUPROFEN 600 MG PO TABS: 600.0000 mg | ORAL_TABLET | Freq: Four times a day (QID) | ORAL | 0 refills | Status: AC

## 2016-11-25 MED ORDER — ACETAMINOPHEN 325 MG PO TABS: 1000.0000 mg | ORAL_TABLET | Freq: Four times a day (QID) | ORAL | Status: AC

## 2016-11-25 MED ORDER — OXYCODONE HCL 5 MG PO TABS: 5.0000 mg | ORAL_TABLET | ORAL | 0 refills | Status: AC | PRN

## 2016-11-25 NOTE — Discharge Instructions (Signed)

## 2016-11-25 NOTE — Progress Notes (Signed)
Discharge instructions complete and prescriptions given. Patient verbalizes understanding of teaching. Patient discharged home at 1400. 

## 2016-12-04 ENCOUNTER — Other Ambulatory Visit: Payer: Self-pay | Admitting: Obstetrics and Gynecology

## 2017-10-31 IMAGING — US US MFM OB FOLLOW-UP
1 series · 12 of 28 positions shown · non-contrast
Comparison: none

PATIENT INFO:

MORALES
PERFORMED BY:
[facility]
SERVICE(S) PROVIDED:
INDICATIONS:
36 weeks gestation of pregnancy
FETAL EVALUATION:
Num Of Fetuses:     1
Presentation:       Vertex
Placenta:           Posterior Grade 2, No previa
AFI Sum(cm)     %Tile       Largest Pocket(cm)
17.59           66
RUQ(cm)       RLQ(cm)       LUQ(cm)        LLQ(cm)
4.45
BIOMETRY:
BPD:      85.5  mm     G. Age:  34w 3d          9  %    CI:        72.93   %    70 - 86
FL/HC:       22.1  %    20.8 -
HC:      318.3  mm     G. Age:  35w 6d          9  %    HC/AC:       0.90       0.92 -
AC:      352.6  mm     G. Age:  39w 1d       > 97  %    FL/BPD:      82.1  %    71 - 87
FL:       70.2  mm     G. Age:  36w 0d         30  %    FL/AC:       19.9  %    20 - 24
HUM:      62.6  mm     G. Age:  36w 2d         60  %
Est. FW:    4774   gm     7 lb 2 oz     70  %
GESTATIONAL AGE:
LMP:           36w 5d        Date:  02/22/16                 EDD:   11/28/16
U/S Today:     36w 3d                                        EDD:   11/30/16
Best:          36w 5d     Det. By:  LMP  (02/22/16)          EDD:   11/28/16
ANATOMY:
Cavum:                 Visualized             Stomach:                Seen
previously
Ventricles:            Normal appearance      Abdominal Wall:         Visualized
Cerebellum:            Visualized             Cord Vessels:           3 vessels,
previously                                     visualized previously
Posterior Fossa:       Visualized             Kidneys:                Normal appearance
Face:                  Orbits visualized      Bladder:                Seen
Lips:                  Visualized             Spine:                  Visualized
previously                                     previously
Heart:                 Seen on Prior          Upper Extremities:      Visualized
RVOT:                  Seen on prior          Lower Extremities:      Visualized
LVOT:                  Seen on prior
CERVIX UTERUS ADNEXA:
Cervix
Length:            2.4  cm.
Comment:      Transvaginal ultrasound showed the posterior
placenta to be at least 2.4 cm from the cervical
os.

[Series 1: us mfm ob follow-up · 12 of 29 slices shown]
[im 2/29]
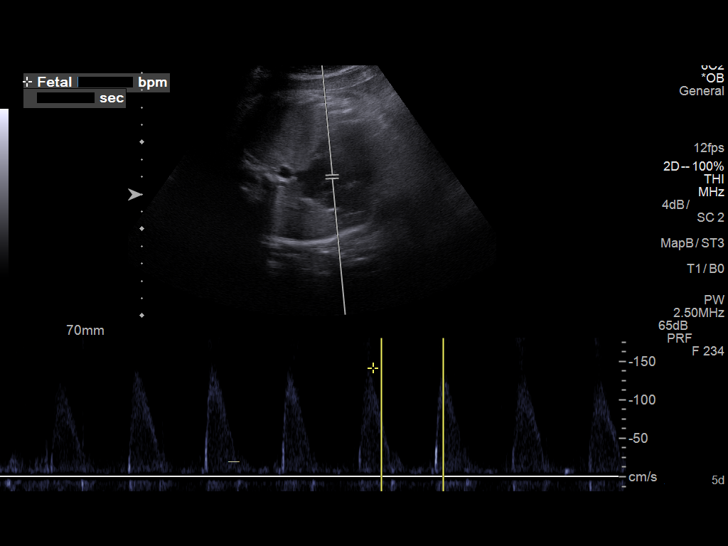
[im 4/29]
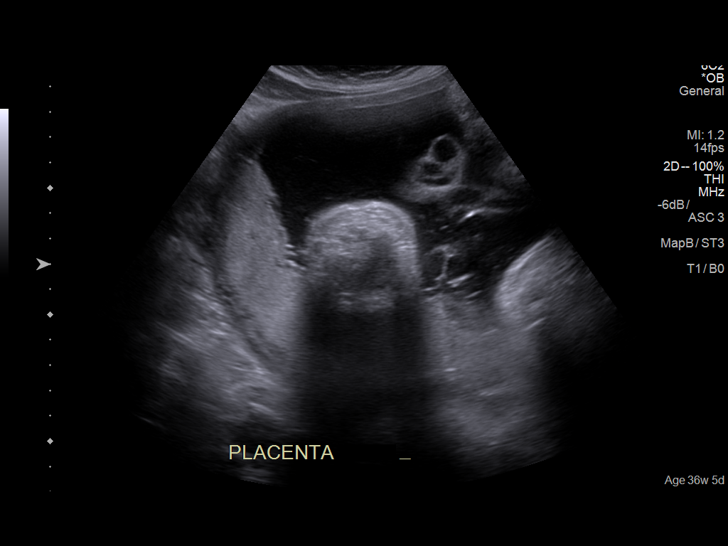
[im 6/29]
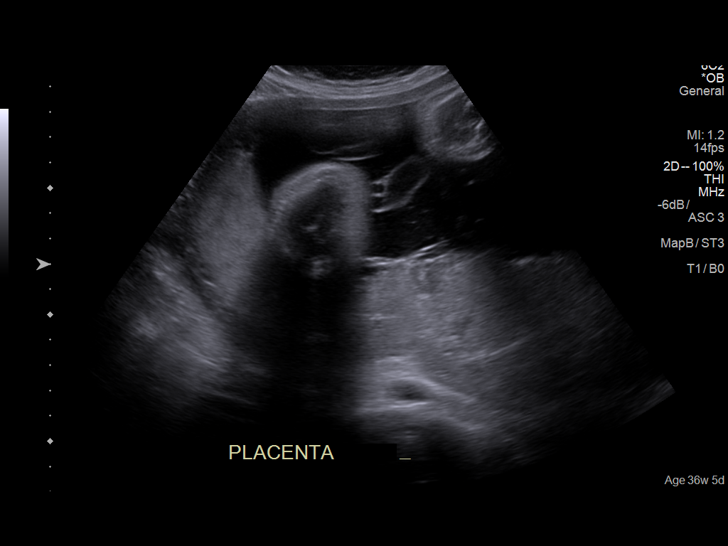
[im 9/29]
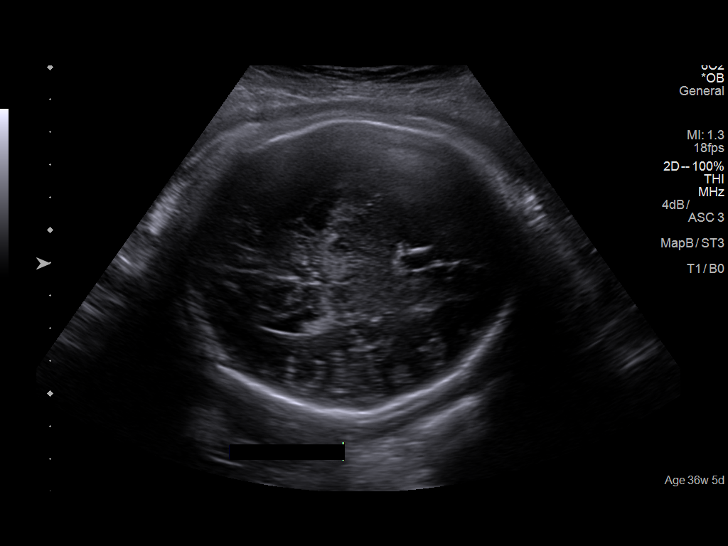
[im 11/29]
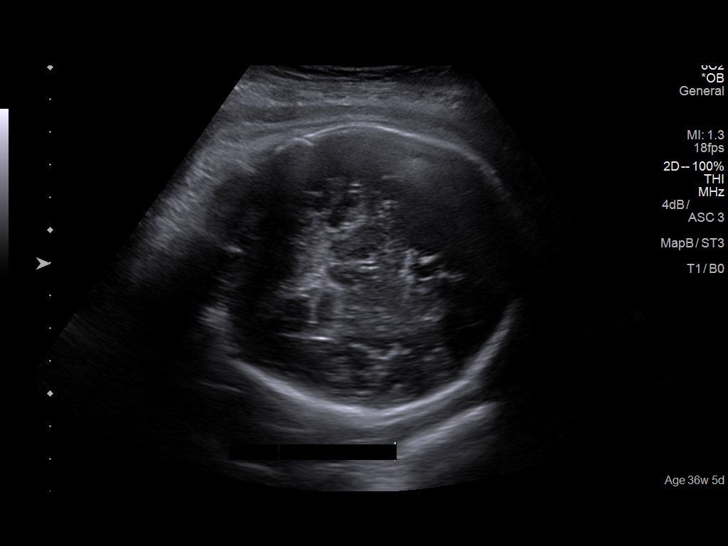
[im 13/29]
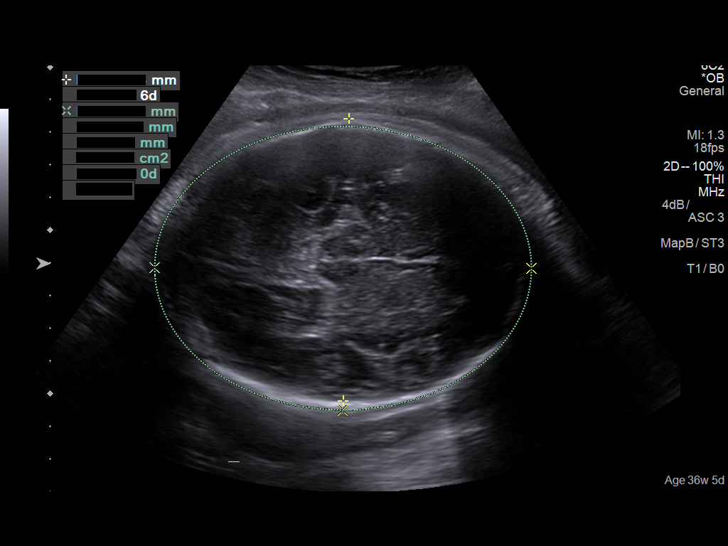
[im 16/29]
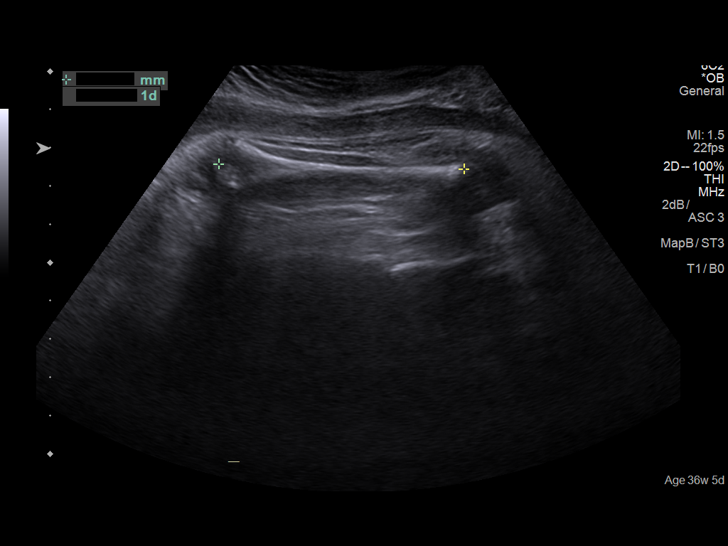
[im 18/29]
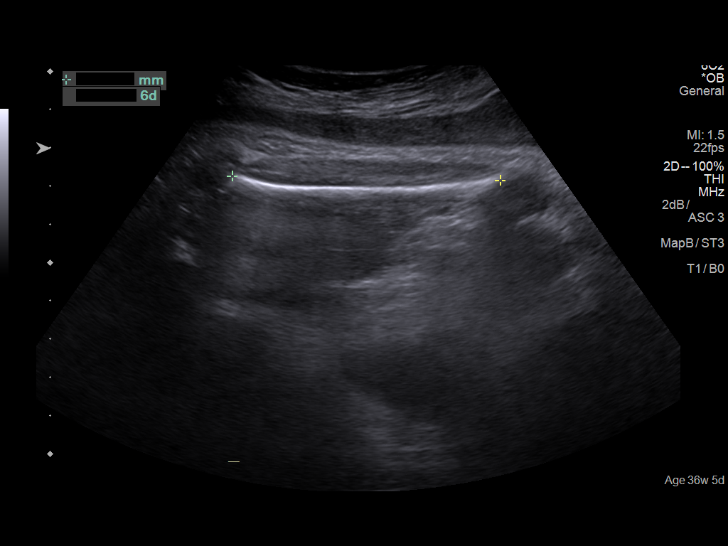
[im 20/29]
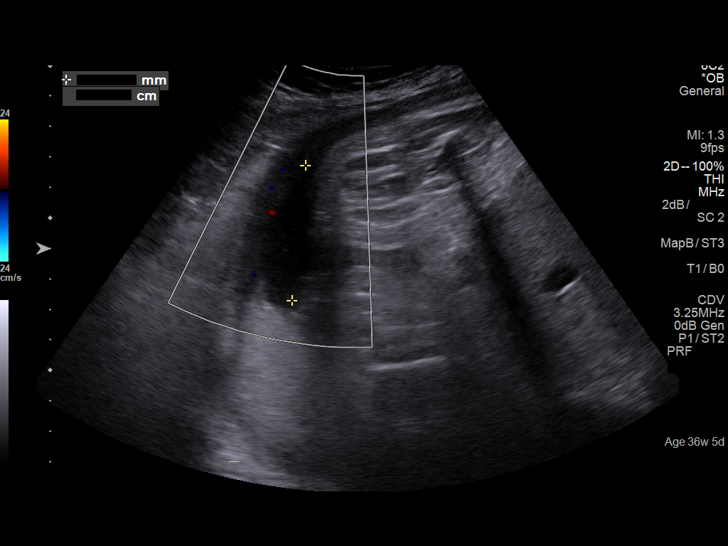
[im 23/29]
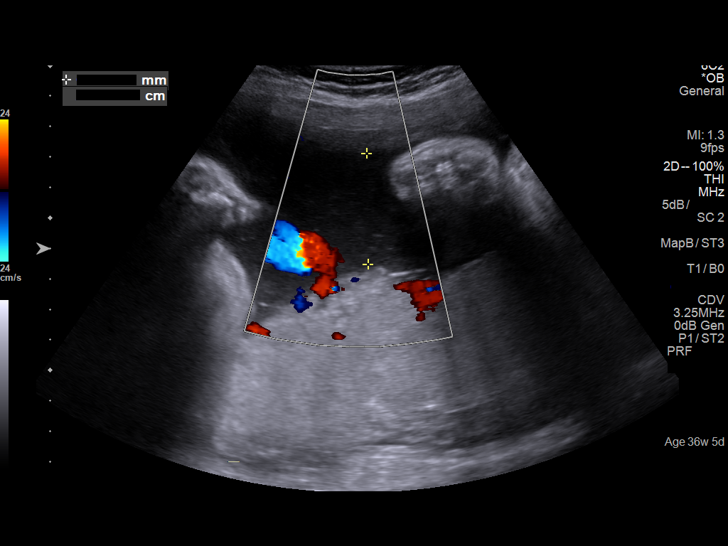
[im 25/29]
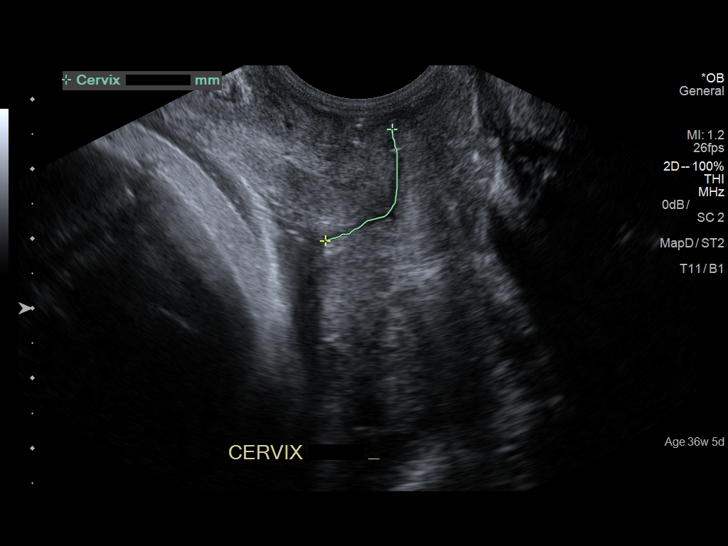
[im 27/29]
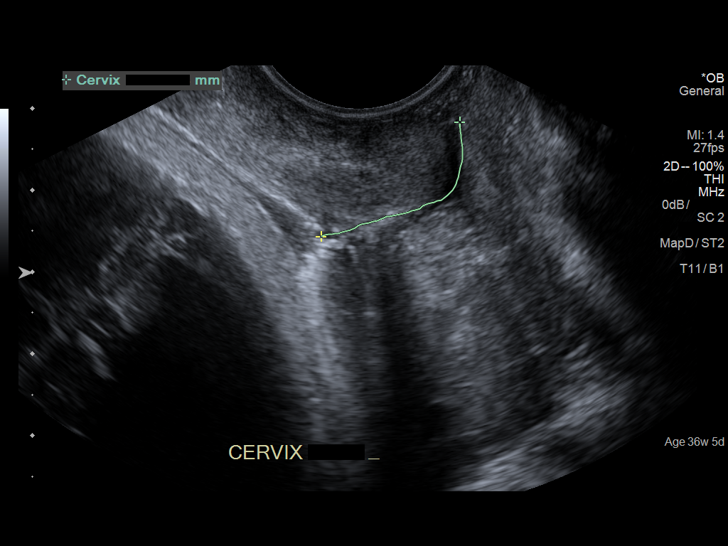

[12 of 28 positions shown; findings below may reference images not displayed]

IMPRESSION: Dear Dr. ASTRIE,

Thank you for referring your patient to [facility] to
evaluate the location of the placenta.

TAS AND TVS WERE PERFORMED BECAUSE NEITHER
ALONE COULD ADEQUATELY EVALUATE THE
PLACENTAL LOCATION.

Today there is a singleton gestation at 14w1d gestation by
LMP and by US here on 07/16/2016.

The fetal biometry correlates with established dating.
Adequate interval growth is noted.
The estimated fetal weight is at the 70th  percentile.  The
abdominal circumferance is greater than the 97th percentile.
An incidental BPP was [DATE].

Fetal anatomy appears normal today or was visualized
previously and appeared normal then.

Transvaginal ultrasound was performed.  The cervical length
measured 2.4  cm.  The placenta was noted to be posterior,
no previa,  measuring  2.4   cm away from the cervical os.

The patient was counseled that the placental edge appears to
be more than 2 cm away from the cervical os and that she
would be a candidate for a vaginal delivery.  She asked if she
could have bleeding with delivery.  She was counseled that
her risk of bleeding should be similar to the risk every woman
has.

Thank you for involving us in the care of your patient.  If you
Tesa Michaud

## 2018-12-31 ENCOUNTER — Other Ambulatory Visit: Payer: Self-pay

## 2018-12-31 DIAGNOSIS — Z20822 Contact with and (suspected) exposure to covid-19: Secondary | ICD-10-CM

## 2019-01-01 LAB — NOVEL CORONAVIRUS, NAA: SARS-CoV-2, NAA: NOT DETECTED

## 2023-05-22 ENCOUNTER — Telehealth: Payer: Self-pay | Admitting: *Deleted

## 2023-05-23 ENCOUNTER — Encounter: Payer: Self-pay | Admitting: Primary Care

## 2023-06-06 ENCOUNTER — Other Ambulatory Visit: Payer: Self-pay | Admitting: Primary Care

## 2023-06-06 DIAGNOSIS — Z1231 Encounter for screening mammogram for malignant neoplasm of breast: Secondary | ICD-10-CM

## 2023-06-14 ENCOUNTER — Ambulatory Visit
Admission: RE | Admit: 2023-06-14 | Discharge: 2023-06-14 | Disposition: A | Discharge status: Home / Self Care | Payer: Self-pay | Source: Ambulatory Visit | Attending: Primary Care | Admitting: Primary Care

## 2023-06-14 ENCOUNTER — Encounter: Payer: Self-pay | Admitting: Radiology

## 2023-06-14 DIAGNOSIS — Z1231 Encounter for screening mammogram for malignant neoplasm of breast: Secondary | ICD-10-CM

## 2023-06-24 ENCOUNTER — Ambulatory Visit (LOCAL_COMMUNITY_HEALTH_CENTER): Payer: Self-pay

## 2023-06-24 DIAGNOSIS — Z23 Encounter for immunization: Secondary | ICD-10-CM

## 2023-06-24 DIAGNOSIS — Z719 Counseling, unspecified: Secondary | ICD-10-CM

## 2023-06-24 NOTE — Progress Notes (Signed)
 In nurse clinic for immunizations as needed for immigration. Limited [facility] record and states no other vaccines. No health insurance.  Meets state criteria for Tdap, MMR, Varicella. Given at No charge to patient.  Paid for Polio, Hep B, Flu.  All of these immunizations were given and tolerated well. Updated [facility] copy given and recommended schedule explained.   [employer] PAP program explained to patient and she plans to initiate application for next doses of MMR, Varicella, Recombivax Hep B. PAP application given to patient. Advised to contact [facility] before next immunization appt and bring financial documentation (such as pay stubs, tax returns) in order to start [employer] P[employer] application process. Questions answered and reports understanding.  Alex Gardener, interpreter today. Jerel Shepherd, RN

## 2024-01-06 ENCOUNTER — Ambulatory Visit (LOCAL_COMMUNITY_HEALTH_CENTER): Payer: Self-pay

## 2024-01-06 DIAGNOSIS — Z719 Counseling, unspecified: Secondary | ICD-10-CM

## 2024-01-06 DIAGNOSIS — Z23 Encounter for immunization: Secondary | ICD-10-CM

## 2024-01-06 NOTE — Progress Notes (Signed)
 In nurse clinic for immunizations as needed for immigration. Patient has no insurance. RN counseled patient on costs and option for [employer] Patient assistance. Patient decides to pay for immunizations today.   Meets criteria for state supplied Tdap and this was given at no charge to patient.   She paid for Hep B, flu, polio, MMR, Varicella today.   Toleated vaccines well. Updated [facility] copy given and reviewed. Questions answered and reports understanding. LILLETTE Roche, interpreter today.
